# Patient Record
Sex: Female | Born: 1975 | Race: White | Hispanic: No | Smoking: Former smoker
Health system: Southern US, Community
[De-identification: ages and names within clinical notes are randomized; demographics above are authoritative.]

## PROBLEM LIST (undated history)

## (undated) DIAGNOSIS — F319 Bipolar disorder, unspecified: Secondary | ICD-10-CM

## (undated) DIAGNOSIS — F909 Attention-deficit hyperactivity disorder, unspecified type: Secondary | ICD-10-CM

## (undated) DIAGNOSIS — F329 Major depressive disorder, single episode, unspecified: Secondary | ICD-10-CM

## (undated) DIAGNOSIS — I10 Essential (primary) hypertension: Secondary | ICD-10-CM

## (undated) DIAGNOSIS — I499 Cardiac arrhythmia, unspecified: Secondary | ICD-10-CM

## (undated) DIAGNOSIS — F32A Depression, unspecified: Secondary | ICD-10-CM

## (undated) DIAGNOSIS — G43909 Migraine, unspecified, not intractable, without status migrainosus: Secondary | ICD-10-CM

## (undated) HISTORY — DX: Attention-deficit hyperactivity disorder, unspecified type: F90.9

## (undated) HISTORY — DX: Major depressive disorder, single episode, unspecified: F32.9

## (undated) HISTORY — PX: EYE SURGERY: SHX253

## (undated) HISTORY — DX: Bipolar disorder, unspecified: F31.9

## (undated) HISTORY — DX: Essential (primary) hypertension: I10

## (undated) HISTORY — DX: Migraine, unspecified, not intractable, without status migrainosus: G43.909

## (undated) HISTORY — DX: Cardiac arrhythmia, unspecified: I49.9

## (undated) HISTORY — DX: Depression, unspecified: F32.A

---

## 2004-10-17 ENCOUNTER — Ambulatory Visit: Payer: Self-pay

## 2004-11-05 ENCOUNTER — Ambulatory Visit: Payer: Self-pay | Admitting: Cardiology

## 2006-01-31 ENCOUNTER — Ambulatory Visit: Payer: Self-pay | Admitting: Cardiology

## 2006-03-27 ENCOUNTER — Other Ambulatory Visit: Admission: RE | Admit: 2006-03-27 | Discharge: 2006-03-27 | Payer: Self-pay | Admitting: Gynecology

## 2007-02-23 ENCOUNTER — Emergency Department (HOSPITAL_COMMUNITY): Admission: EM | Admit: 2007-02-23 | Discharge: 2007-02-23 | Payer: Self-pay | Admitting: *Deleted

## 2008-03-03 ENCOUNTER — Ambulatory Visit (HOSPITAL_COMMUNITY): Admission: RE | Admit: 2008-03-03 | Discharge: 2008-03-03 | Payer: Self-pay | Admitting: Obstetrics and Gynecology

## 2008-08-25 IMAGING — CR DG CHEST 2V
2 series · 2 of 2 positions shown · non-contrast
Comparison: none

CLINICAL DATA: Shortness of breath

Chest 2 view:
No previous for comparison. The heart size and mediastinal contours are within
normal limits.  Both lungs are clear.  The visualized skeletal structures are
unremarkable.

[w chest pa]
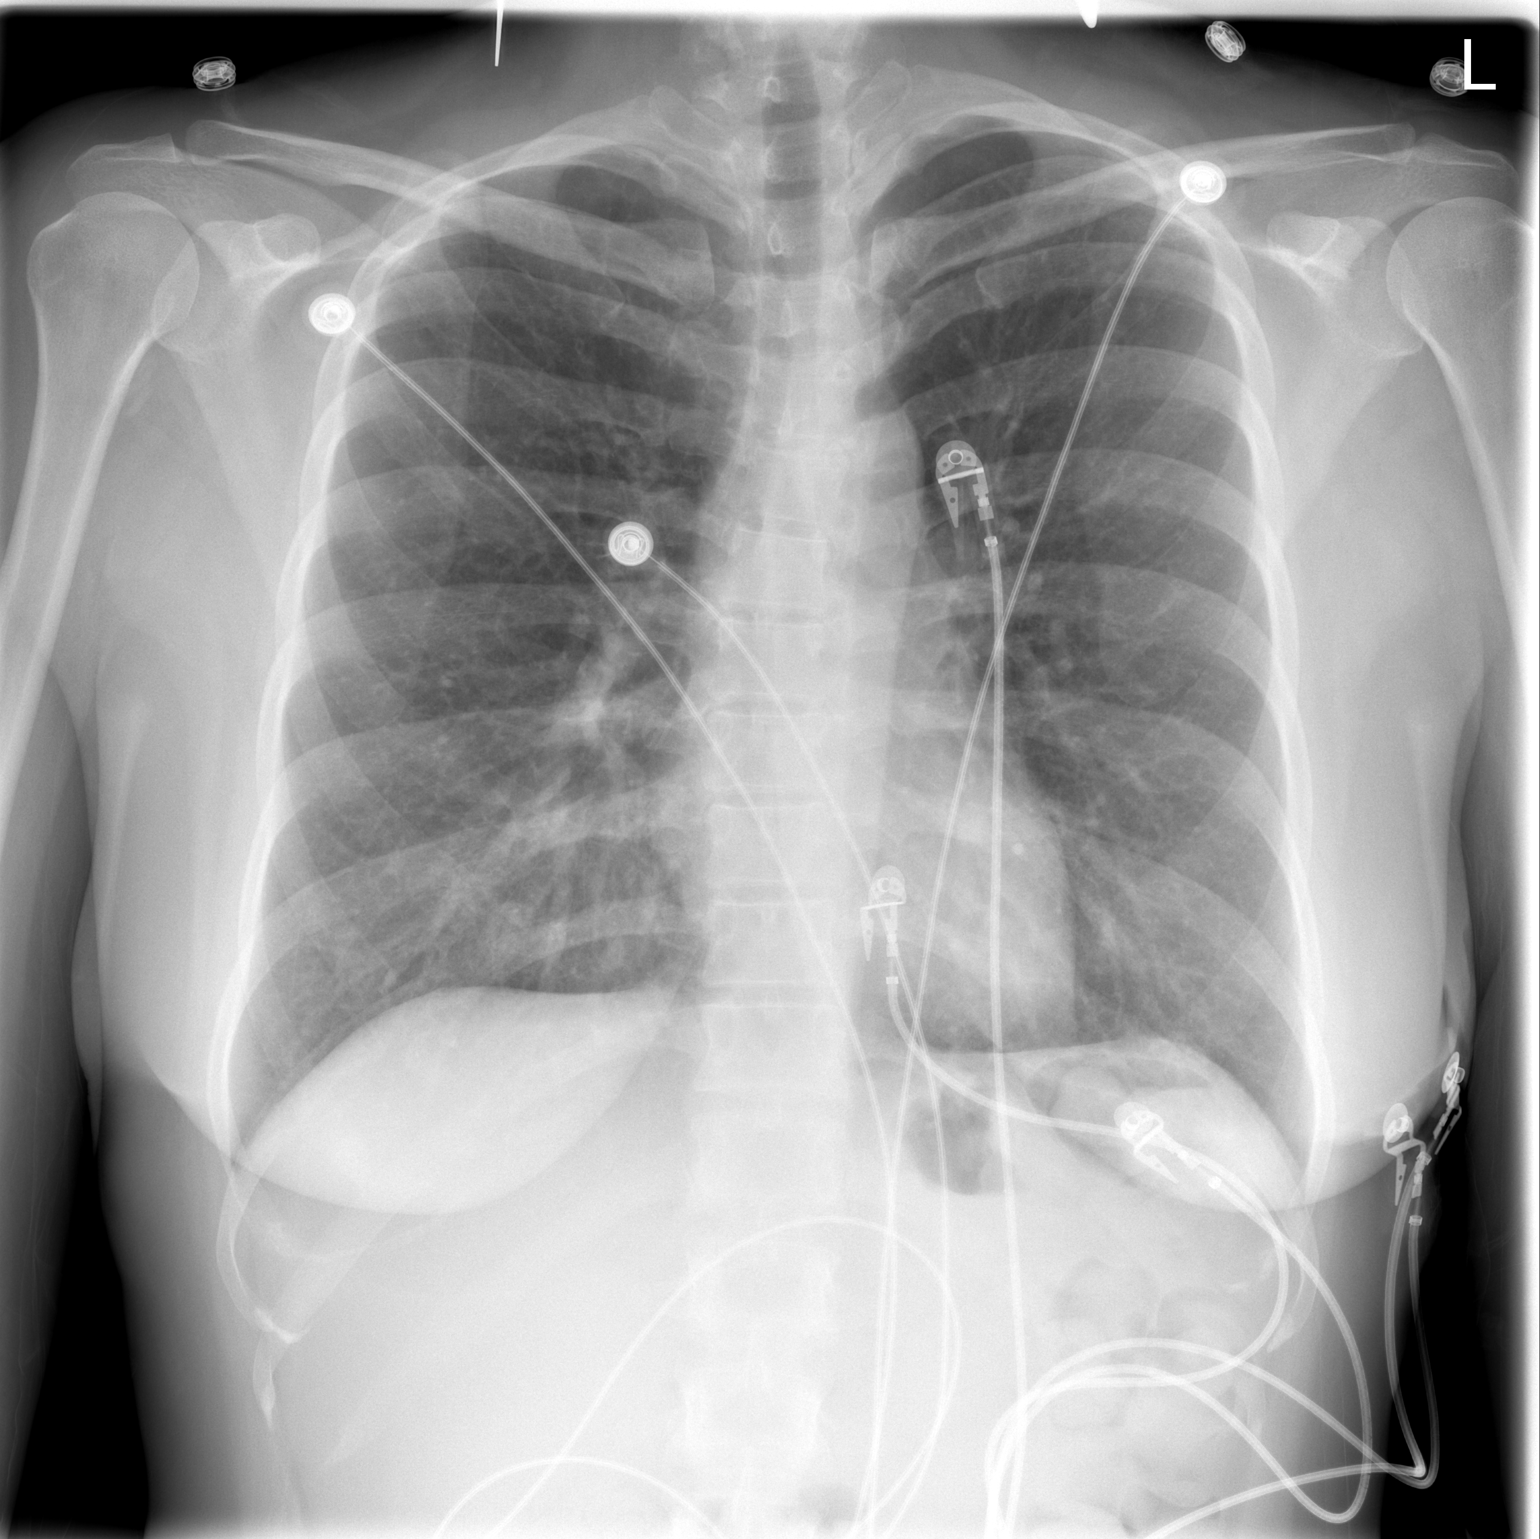

[w chest lat]
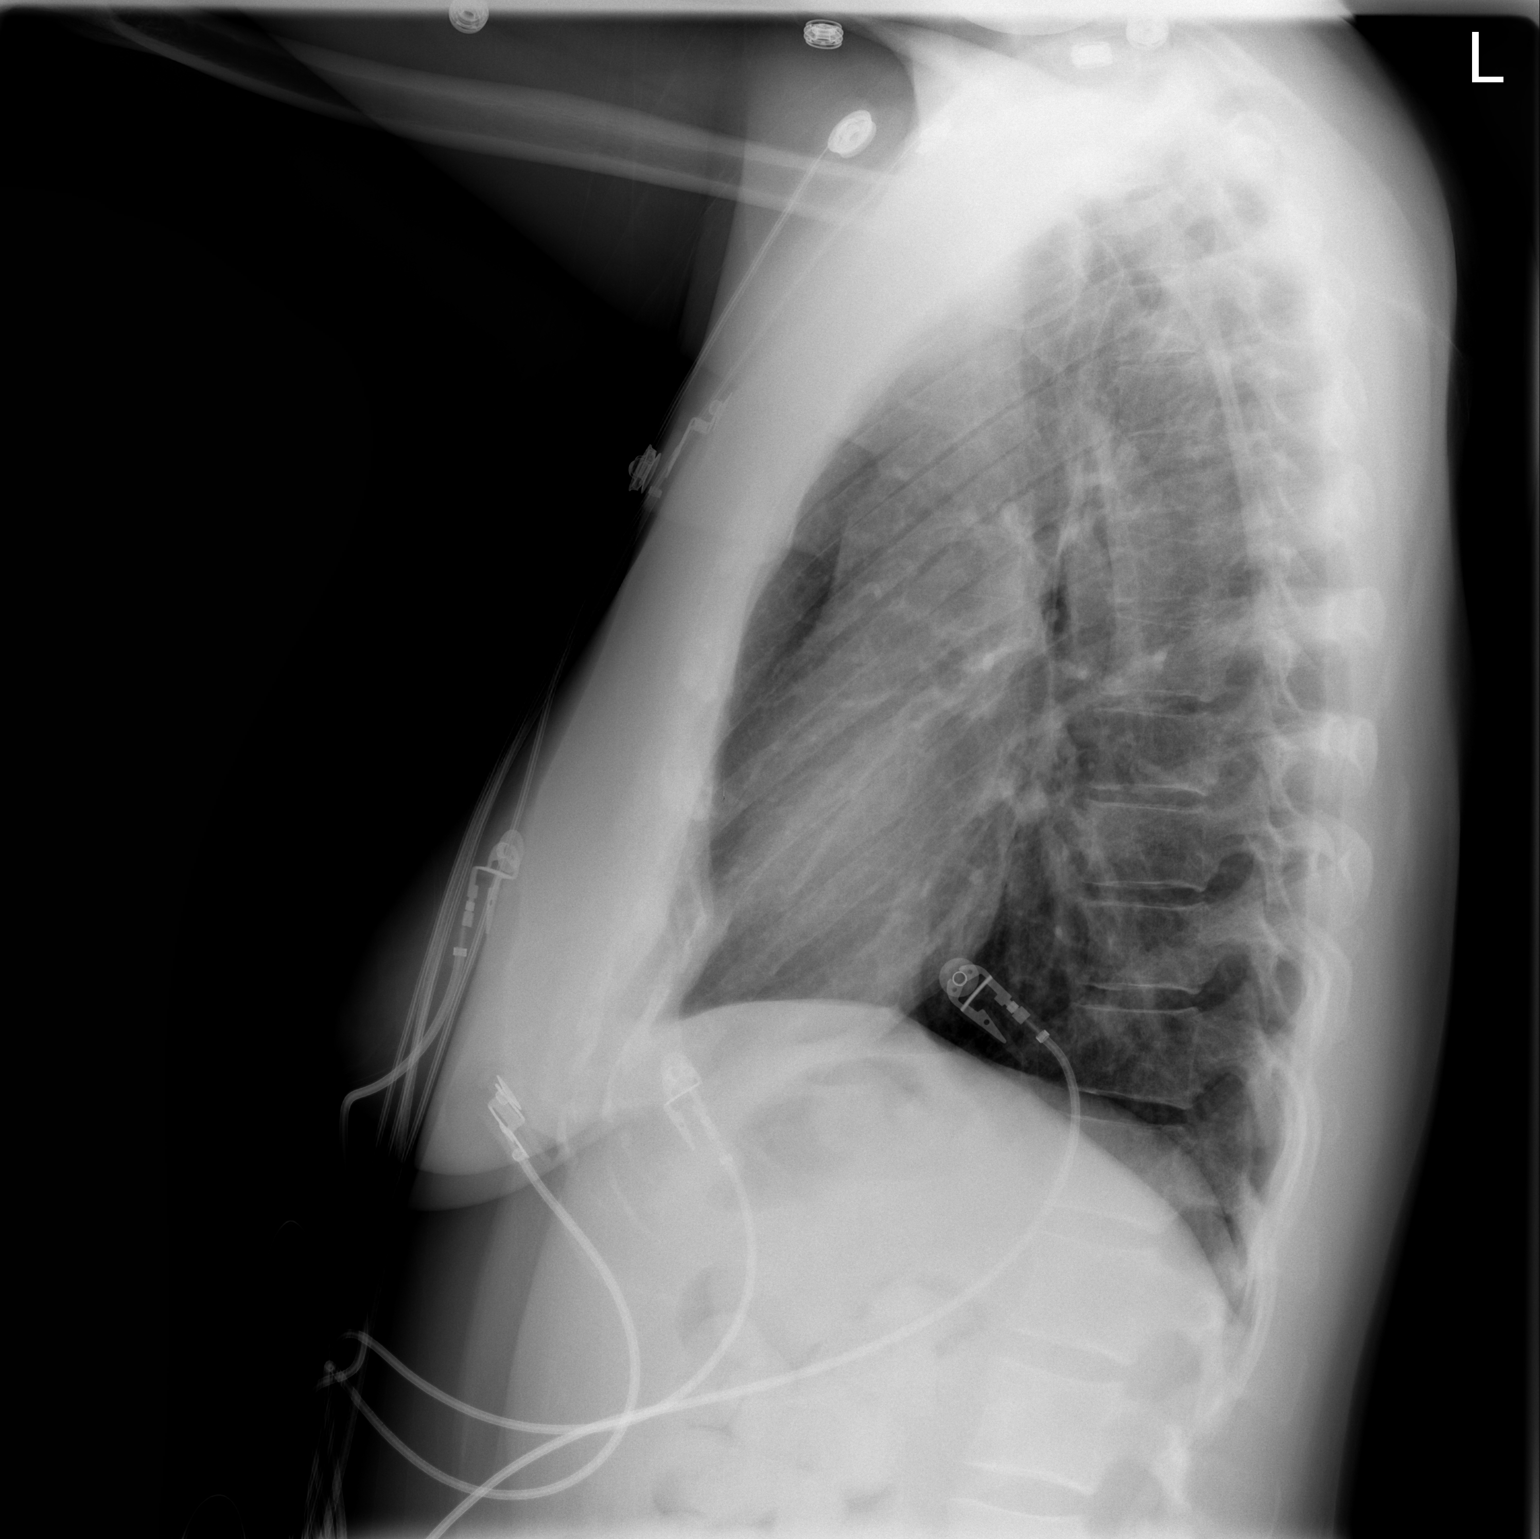

[2 of 2 positions shown; findings below may reference images not displayed]

IMPRESSION: 1. No active cardiopulmonary disease.

## 2008-09-11 ENCOUNTER — Encounter (INDEPENDENT_AMBULATORY_CARE_PROVIDER_SITE_OTHER): Payer: Self-pay | Admitting: Obstetrics and Gynecology

## 2008-09-11 ENCOUNTER — Inpatient Hospital Stay (HOSPITAL_COMMUNITY): Admission: AD | Admit: 2008-09-11 | Discharge: 2008-09-13 | Payer: Self-pay | Admitting: Obstetrics and Gynecology

## 2009-12-01 ENCOUNTER — Emergency Department (HOSPITAL_COMMUNITY): Admission: EM | Admit: 2009-12-01 | Discharge: 2009-12-02 | Payer: Self-pay | Admitting: Emergency Medicine

## 2010-05-02 LAB — POCT CARDIAC MARKERS
CKMB, poc: <1 ng/mL — ABNORMAL LOW (ref 1.0–8.0)
Troponin i, poc: <0.05 ng/mL (ref 0.00–0.09)

## 2010-05-03 LAB — COMPREHENSIVE METABOLIC PANEL
ALT: 92 U/L — ABNORMAL HIGH (ref 0–35)
AST: 74 U/L — ABNORMAL HIGH (ref 0–37)
Alkaline Phosphatase: 31 U/L — ABNORMAL LOW (ref 39–117)
CO2: 22 mEq/L (ref 19–32)
Chloride: 105 mEq/L (ref 96–112)
GFR calc Af Amer: >60 mL/min (ref >60)
GFR calc non Af Amer: >60 mL/min (ref >60)
Glucose, Bld: 117 mg/dL — ABNORMAL HIGH (ref 70–99)
Potassium: 3.7 mEq/L (ref 3.5–5.1)
Sodium: 137 mEq/L (ref 135–145)
Total Bilirubin: 0.9 mg/dL (ref 0.3–1.2)

## 2010-05-03 LAB — CBC
HCT: 44.1 % (ref 36.0–46.0)
Hemoglobin: 15.4 g/dL — ABNORMAL HIGH (ref 12.0–15.0)
RBC: 4.67 MIL/uL (ref 3.87–5.11)

## 2010-05-03 LAB — POCT CARDIAC MARKERS
CKMB, poc: <1 ng/mL — ABNORMAL LOW (ref 1.0–8.0)
Troponin i, poc: <0.05 ng/mL (ref 0.00–0.09)

## 2010-05-03 LAB — DIFFERENTIAL
Basophils Absolute: 0 10*3/uL (ref 0.0–0.1)
Basophils Relative: 0 % (ref 0–1)
Eosinophils Absolute: 0 10*3/uL (ref 0.0–0.7)
Eosinophils Relative: 1 % (ref 0–5)
Lymphs Abs: 1.2 10*3/uL (ref 0.7–4.0)
Neutrophils Relative %: 71 % (ref 43–77)

## 2010-05-03 LAB — D-DIMER, QUANTITATIVE: D-Dimer, Quant: 0.22 ug/mL-FEU (ref 0.00–0.48)

## 2010-05-27 LAB — CBC
Hemoglobin: 10.5 g/dL — ABNORMAL LOW (ref 12.0–15.0)
Hemoglobin: 9.8 g/dL — ABNORMAL LOW (ref 12.0–15.0)
Platelets: 181 10*3/uL (ref 150–400)
RBC: 3.29 MIL/uL — ABNORMAL LOW (ref 3.87–5.11)
RDW: 16.1 % — ABNORMAL HIGH (ref 11.5–15.5)
WBC: 7.6 10*3/uL (ref 4.0–10.5)

## 2010-05-27 LAB — URINALYSIS, DIPSTICK ONLY
Bilirubin Urine: NEGATIVE
Glucose, UA: NEGATIVE mg/dL
Leukocytes, UA: NEGATIVE
Nitrite: NEGATIVE
Specific Gravity, Urine: >1.03 — ABNORMAL HIGH (ref 1.005–1.030)
pH: 6 (ref 5.0–8.0)

## 2010-07-03 NOTE — Letter (Signed)
March 03, 2008    Tricia Sanford, M.D.  [facility]  [address]  [place], [place] [address]   Tricia Sanford, Tricia Sanford  MR#   [id number]  ACC#        [id number]   MFM CONSULTATION REPORT   Dear Dr. Renaldo Fiddler:   Thank you for referring your patient, Tricia Sanford, for maternal  fetal medicine consultation.  As you are aware, Tricia Sanford is a 35-year-  old gravida 3, para 2 at 12 weeks and 5 days gestation based on her  early ultrasound.  As you are also aware, Tricia Sanford has a history of  bipolar disorder and is on multiple medications for treatment of this.   Tricia Sanford was feeling well today and had no specific complaints.  She  denied any uterine cramping or vaginal bleeding since her last menstrual  period.   Tricia Sanford has a long history of bipolar disorder.  Her current  medications consist of Lamictal 20 mg daily, Seroquel 400 mg daily and  Concerta 54 mg daily.  She reports being on this regimen for the last 2-  3 years and follows closely with Dr. Kathrynn Running, her primary care  physician.  She states that she sees Dr. Kathrynn Running approximately every 3  months.   In addition to bipolar disorder, Tricia Sanford also has a history of  hypothyroidism for which she is treated with Synthroid 50 mg daily.  She  reports undergoing evaluation of her thyroid function early in this  gestation with normal results.  Prenatal records indicate these were  drawn on February 16, 2008, and TSH was 3.275.  Of note, her free T4 was  slightly out of range and on the low side at 0.84 ng/dL.   Tricia Sanford other medications include prenatal vitamins and over-the-  counter flaxseed oil and acidophilus.  She has no significant surgical  history and had has no history of abnormal Pap smears or GYN procedures.  Tricia Sanford does not smoke or use alcohol or street drugs.  She denies any  significant genetic diseases or congenital anomalies in either her or  the father of the baby's family.  Ms.  Catha Sanford past obstetrical history  includes 2 prior term vaginal deliveries without complication.   On exam today, Tricia Sanford appeared in no acute distress.  Her blood  pressure was 137/87, and her pulse was 82 beats per minute.  Her weight  was 183 pounds.   The implications of bipolar disorder as well as hypothyroidism were  discussed with Tricia Sanford at length including potential fetal effects of  her medications.  It was emphasized that the lowest dose of the least  number of medications to achieve good control of her symptoms was the  preferred approach to use of these drugs during pregnancy.  Tricia Sanford is  currently stable on her current regimen, and thus, a careful look at the  risk to benefit ratio for Tricia Sanford will be necessary.   Specifically, Seroquel has not been associated with any consistent  pattern of congenital anomalies in infants of mothers exposed to this  medication in utero.  There may be a slight increase above baseline of  anomalies as a whole, but again no specific pattern is seen.  The  product labeling for Seroquel recommends that breast-feeding be avoided  with the use of this medication, but there is minimal data to support  this recommendation.  Lamictal may be associated with a slight increase  in oral clefting.  This has been demonstrated in Sanford case studies.  There is also a question that neural tube anomalies may be more frequent  in babies exposed to this medication.  However, again, these are limited  studies with limited number of patients.  There may be some concern for  breast-feeding in moms taking Lamictal as high percentage of this is  excreted into breast milk.  However, studies looking at breastfed  infants has not demonstrated adverse effect.  Concerta is in the class  of the drugs of amphetamines.  There has been no increase in congenital  anomalies noted with exposure to this medication.  However, low birth  weight as well as a neonate  withdrawal syndrome has been described.  This seems to have a dose response relationship, and so patients are on  lower doses or intermittent dosing of these medications tend to see less  effects from this.  In addition to the risks associated with each  specific agent, it emphasized to Tricia Sanford that we cannot comment on the  interaction of these agents when taken together and the effect that this  may have on her fetus.  It was also stressed that these risks appeared  to be Sanford, and if Tricia Sanford receives significant benefit from the use  of these medications during pregnancy, it may be preferable to continue  their use.  Discontinuation of these kinds of medications can lead to  significant recurrence of psychiatric symptoms.   Hypothyroidism and the benefits of maintaining an euthyroid state during  pregnancy were also discussed with Tricia Sanford.  Her last set of thyroid  function studies indicate a slightly decreased free T4 level.  Reevaluation of these levels is recommended, and should that be a  persistent finding, an increase in her dose of Synthroid would be  advisable.  Thyroid function studies should be checked every semester  including a TSH, free T4 and free T3.  Adjustments in Synthroid should  be made to keep the patient's levels within the normal range or slightly  above normal for free T3 and free T4 levels.  The benefits of this  approach to the fetus were emphasized.   Tricia Sanford was advised to continue close care with her psychiatric  Sion Reinders.  She will discuss her medications and what if any changes  might be advisable for her with him at her next follow-up visit.  He was  also discussed that Tricia Sanford is nearly through the period of  organogenesis for her fetus and that changes for the purpose of voiding  anomalies may be of limited benefit at this time.  We would recommend  serial ultrasounds to evaluate fetal growth through the late second and  third trimester.   Additionally, a detailed fetal anatomic survey is  recommend at approximately 18 weeks' gestation.  It will also be  necessary to notify the neonatologist at the time of delivery that Ms.  Sanford has been exposed to these medications, particularly Concerta, and  close observation for the development of neonatal withdrawal syndromes  will be necessary.  Thank you for allowing [place] to participate in the care  of Tricia Sanford.  We will be pleased to perform any of these exams.  If  this is desired, please call our office to schedule this appointment.  The number is [phone].  Please feel free to contact us at any time  regarding this or any other patient you may have.  Prenatal  Sincerely,      Heather L. Rachel Bo, MD  Maternal Fetal Medicine  [facility]     HLM/MEDQ  D:  03/04/2008  T:  03/04/2008  Job:  [id number]

## 2010-07-06 NOTE — Assessment & Plan Note (Signed)
[facility]                            CARDIOLOGY OFFICE NOTE   NAME:SMALLEimy, Plaza                      MRN:          [id number]  DATE:01/31/2006                            DOB:          1975-10-19    REFERRING PHYSICIAN:  Areatha Keas, M.D.   PRIMARY CARE PHYSICIAN:  Dr. Merri Brunette.   REASON FOR REFERRAL:  Evaluate patient with shortness of breath.   HISTORY OF PRESENT ILLNESS:  The patient is a very lovely 35 year old  white female with a complicated past history.  She has a long history of  Sjogren's disease.  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  She subsequently had 6 cycles of CHOP, completed in April.  She  also had intrathecal therapy completed in July.  She had a lung nodule  which was found to be an adenocarcinoma, and had resection of this and  resection of her thymus gland in April.  Through all of this she said  she did relatively well.  However, since the end of September she has  had increasing dyspnea.  This has been progressive.  She has noticed it  doing such things as walking a flight of stairs.  Over the last several  nights she has felt more short of breath at night and has been sleeping  on 2 pillows.  She is not waking up with significant dyspnea.  She says  her chest feels full.  She is not describing chest pressure or neck  discomfort.  She has had no arm discomfort.  She is not describing  palpitations, presyncope or syncope.  She has had no weight gain, cough  or fever.  She has had no lower extremity swelling.  She was seen by Dr.  Phylliss Bob in routine followup and referred here with question of congestive  heart failure.   PAST MEDICAL HISTORY:  1. Sjogren's syndrome.  2. Vulvar cancer.  3. Adenocarcinoma of the lung.  4. B-cell lymphoma.  5. Anemia.   PAST SURGICAL HISTORY:  1. Mediastinoscopy with biopsy of the thymus  gland, parotid gland,      nasopharynx, maxillary sinus.  2. Thymusectomy and wedge resection of the right upper lobe.   ALLERGIES:  SULFA and ERYTHROMYCIN.   MEDICATIONS:  Restasis drops.   SOCIAL HISTORY:  The patient is currently unemployed with her multiple  illnesses.  She has one young adult daughter.  She smoked a pack per day  for 25 years but quit 1 year ago.   FAMILY HISTORY:  Noncontributory for early coronary artery disease.   REVIEW OF SYSTEMS:  Positive for headaches, dry mouth, cramps, joint  pains related to vasculitis.  Negative for other systems.   PHYSICAL EXAMINATION:  The patient is in no acute distress.  Weight 177  pounds, body mass index 29, blood pressure 108/64, heart rate 104 and  regular.  HEENT:  Eyelids unremarkable, pupils equal, round and reactive to light,  fundi within normal limits, oral mucosa unremarkable.  NECK:  No  jugular venous distention at 45 degrees, carotid upstroke  brisk and symmetric, no bruits, no thyromegaly.  LYMPHATICS:  No cervical, axillary or inguinal adenopathy.  LUNGS:  Clear to auscultation bilaterally.  BACK:  No costovertebral angle tenderness.  CHEST:  Unremarkable.  HEART:  PMI not displaced or sustained, S1 and S2 within normal limits,  no S4, positive loud S3, no murmurs.  ABDOMEN:  Flat, positive bowel sounds, normal in frequency and pitch, no  bruits, no rebound, no guarding, no midline pulsatile mass, no  hepatomegaly, no splenomegaly.  SKIN:  No rashes, no nodules.  EXTREMITIES:  2+ pulses, trace bilateral lower extremity edema, no  cyanosis, no clubbing.  NEUROLOGIC:  Oriented to person, place and time, cranial nerves II-XII  grossly intact, motor grossly intact.   EKG sinus tachycardia, rate 104, right superior axis, premature  ventricular contractions.   Echocardiogram done today demonstrated EF of 20% to 25% with diffuse  ventricular hypokinesis.  There is mild to moderate mitral  regurgitation.  There  was moderately reduced right ventricular systolic  dysfunction.   ASSESSMENT AND PLAN:  1. Cardiomyopathy:  The patient has a newly-diagnosed cardiomyopathy,      as Dr. Phylliss Bob suspected.  This is almost definitely related to her      chemotherapy.  I spent quite a bit of time discussing this with her      today.  We went over the physiology and treatment.  At this point I      need to get a BMET.  I will also do a TSH, liver profile and a CBC.      She will also get a baseline BNP level.  I am going to treat her      first symptomatically with Lasix 40 mg daily.  She will get      potassium 20 mEq daily to go along with this.  I am going to start      enalapril 2.5 mg b.i.d.  She understands that if she gets worse      rather than better she needs to present to the hospital for further      therapy.  I am going to see her early next week for further      management and med titration.  She will eventually have a workup to      exclude ischemia,      though I think this is very unlikely.  2. Followup as above.     Rollene Rotunda, MD, [facility]  Electronically Signed    JH/MedQ  DD: 01/31/2006  DT: 02/01/2006  Job #: [id number]   cc:   Areatha Keas, M.D.  Dario Guardian, M.D.

## 2010-11-08 LAB — CBC
HCT: 38.6
Hemoglobin: 13
MCHC: 33.8
MCV: 86.8
Platelets: 231
RBC: 4.45
RDW: 17.1 — ABNORMAL HIGH
WBC: 5.1

## 2010-11-08 LAB — URINALYSIS, ROUTINE W REFLEX MICROSCOPIC
Glucose, UA: NEGATIVE
Ketones, ur: 15 — AB
Protein, ur: NEGATIVE

## 2010-11-08 LAB — COMPREHENSIVE METABOLIC PANEL
BUN: 5 — ABNORMAL LOW
CO2: 23
Calcium: 9.2
Creatinine, Ser: 0.6
GFR calc non Af Amer: >60
Glucose, Bld: 146 — ABNORMAL HIGH

## 2010-11-08 LAB — POCT CARDIAC MARKERS
Myoglobin, poc: 26.3
Operator id: 288831
Troponin i, poc: <0.05

## 2010-11-08 LAB — DIFFERENTIAL
Eosinophils Absolute: 0.1
Lymphocytes Relative: 32
Lymphs Abs: 1.6
Neutrophils Relative %: 55

## 2010-11-08 LAB — URINE MICROSCOPIC-ADD ON

## 2012-01-27 DIAGNOSIS — F319 Bipolar disorder, unspecified: Secondary | ICD-10-CM | POA: Insufficient documentation

## 2012-03-09 DIAGNOSIS — K7581 Nonalcoholic steatohepatitis (NASH): Secondary | ICD-10-CM | POA: Insufficient documentation

## 2013-03-01 DIAGNOSIS — I10 Essential (primary) hypertension: Secondary | ICD-10-CM | POA: Insufficient documentation

## 2016-01-20 ENCOUNTER — Other Ambulatory Visit (HOSPITAL_COMMUNITY): Payer: Self-pay | Admitting: Psychiatry

## 2016-06-20 DIAGNOSIS — Z6832 Body mass index (BMI) 32.0-32.9, adult: Secondary | ICD-10-CM

## 2016-06-20 DIAGNOSIS — E6609 Other obesity due to excess calories: Secondary | ICD-10-CM | POA: Insufficient documentation

## 2018-01-30 DIAGNOSIS — L2082 Flexural eczema: Secondary | ICD-10-CM | POA: Insufficient documentation

## 2018-04-01 ENCOUNTER — Encounter (HOSPITAL_COMMUNITY): Payer: Self-pay | Admitting: Psychiatry

## 2018-04-01 ENCOUNTER — Encounter (INDEPENDENT_AMBULATORY_CARE_PROVIDER_SITE_OTHER): Payer: Self-pay

## 2018-04-01 ENCOUNTER — Ambulatory Visit (INDEPENDENT_AMBULATORY_CARE_PROVIDER_SITE_OTHER): Payer: Commercial Managed Care - PPO | Admitting: Psychiatry

## 2018-04-01 VITALS — BP 120/81 | HR 87 | Ht 64.0 in | Wt 189.0 lb

## 2018-04-01 DIAGNOSIS — F319 Bipolar disorder, unspecified: Secondary | ICD-10-CM

## 2018-04-01 DIAGNOSIS — G43909 Migraine, unspecified, not intractable, without status migrainosus: Secondary | ICD-10-CM | POA: Insufficient documentation

## 2018-04-01 DIAGNOSIS — F901 Attention-deficit hyperactivity disorder, predominantly hyperactive type: Secondary | ICD-10-CM | POA: Diagnosis not present

## 2018-04-01 DIAGNOSIS — F988 Other specified behavioral and emotional disorders with onset usually occurring in childhood and adolescence: Secondary | ICD-10-CM | POA: Insufficient documentation

## 2018-04-01 DIAGNOSIS — E039 Hypothyroidism, unspecified: Secondary | ICD-10-CM | POA: Insufficient documentation

## 2018-04-01 MED ORDER — ARIPIPRAZOLE 15 MG PO TABS: 15.0000 mg | ORAL_TABLET | Freq: Every day | ORAL | 1 refills | Status: DC

## 2018-04-01 MED ORDER — LAMOTRIGINE 200 MG PO TABS: 200.0000 mg | ORAL_TABLET | Freq: Every day | ORAL | 1 refills | Status: DC

## 2018-04-01 MED ORDER — METHYLPHENIDATE HCL ER (OSM) 54 MG PO TBCR: 54.0000 mg | EXTENDED_RELEASE_TABLET | ORAL | 0 refills | Status: DC
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
## 2018-05-12 ENCOUNTER — Other Ambulatory Visit: Payer: Self-pay

## 2018-05-12 ENCOUNTER — Ambulatory Visit (INDEPENDENT_AMBULATORY_CARE_PROVIDER_SITE_OTHER): Payer: Commercial Managed Care - PPO | Admitting: Psychiatry

## 2018-05-12 ENCOUNTER — Encounter (HOSPITAL_COMMUNITY): Payer: Self-pay | Admitting: Psychiatry

## 2018-05-12 DIAGNOSIS — F319 Bipolar disorder, unspecified: Secondary | ICD-10-CM

## 2018-05-12 DIAGNOSIS — F901 Attention-deficit hyperactivity disorder, predominantly hyperactive type: Secondary | ICD-10-CM

## 2018-05-12 MED ORDER — LAMOTRIGINE 200 MG PO TABS: 200.0000 mg | ORAL_TABLET | Freq: Every day | ORAL | 0 refills | Status: DC

## 2018-05-12 MED ORDER — METHYLPHENIDATE HCL ER (OSM) 54 MG PO TBCR: 54.0000 mg | EXTENDED_RELEASE_TABLET | ORAL | 0 refills | Status: DC

## 2018-05-12 MED ORDER — ARIPIPRAZOLE 15 MG PO TABS: 15.0000 mg | ORAL_TABLET | Freq: Every day | ORAL | 0 refills | Status: DC

## 2018-05-12 NOTE — Progress Notes (Signed)
BH MD/PA/NP OP Progress Note  05/12/2018 10:37 AM Tricia Sanford  MRN:  [id number]  Chief Complaint: I am taking my medication.  Sometimes I get overwhelmed because of pandemic Covid 19 coronavirus.  HPI: Tricia Sanford came for her appointment.  She is a 43 year old female who was seen first time in February 2020.  She is a diagnosis of bipolar disorder, anxiety and ADHD.  She was taking Ritalin, Concerta, Lamictal and Abilify.  She was also taking Xanax.  We have discontinue her Ritalin and Xanax.  Patient has no formal psychological testing and the medications were given by primary care physician who had left the practice.  We have referred her to do psychological testing but she has not heard from the office.  However she noticed stopping Ritalin she is not having as much anxiety and does not require to take Xanax.  She is overwhelmed because of pandemic coronavirus.  She sometimes gets stressed because of home schooling of his 41-year-old son and also working from home.  But she is pleased and relieved that she is working from home.  Overall she admitted less emotional, tearful and having crying spells.  Her attention and focus is okay.  She is able to do multitasking.  She denies any paranoia, hallucination or any severe mood swings or highs and lows.  Her sister and her 19 year old nephew lives with her.  She is working as a [employer] from home.  She denies any suicidal thoughts or homicidal thought.  She denies any rash or any itching.  She has not decided therapy because she feels things are going very well.  She like to continue her current medication.  She admitted drinking very rarely but significantly cut down in recent months.  She denies any illegal substance use.  Her energy level is okay.  Visit Diagnosis:    ICD-10-CM   1. Attention deficit hyperactivity disorder (ADHD), predominantly hyperactive type F90.1 methylphenidate 54 MG PO CR tablet  2. Bipolar I disorder (HCC) F31.9  lamoTRIgine (LAMICTAL) 200 MG tablet    ARIPiprazole (ABILIFY) 15 MG tablet    Past Psychiatric History: Reviewed. H/O mood swing, anger, auditory and visual hallucination, impulsive behavior with excessive buying and shopping.  H/O inpatient in 2001 at [facility] for taking overdose with alcohol.  Never seen psychiatrist upon discharge and given psychiatric medication by PCP.  Took Seroquel but stopped after pregnancy.  Took Ritalin, Concerta, Abilify, Lamictal and Xanax from PCP.  Never had psychological testing for ADHD.    Past Medical History:  Past Medical History:  Diagnosis Date  . ADHD (attention deficit hyperactivity disorder)   . Arrhythmia   . Bipolar disorder (HCC)   . Depression   . Hypertension   . Migraines     Past Surgical History:  Procedure Laterality Date  . EYE SURGERY      Family Psychiatric History: Reviewed  Family History:  Family History  Problem Relation Age of Onset  . Alcohol abuse Father   . ADD / ADHD Sister   . Bipolar disorder Sister   . Depression Sister   . Drug abuse Sister   . Physical abuse Sister     Social History:  Social History   Socioeconomic History  . Marital status: Divorced    Spouse name: Not on file  . Number of children: 3  . Years of education: Not on file  . Highest education level: High school graduate  Occupational History  . Not on file  Social Needs  .  Financial resource strain: Not hard at all  . Food insecurity:    Worry: Never true    Inability: Never true  . Transportation needs:    Medical: No    Non-medical: No  Tobacco Use  . Smoking status: Former Smoker    Types: Cigarettes  . Smokeless tobacco: Never Used  Substance and Sexual Activity  . Alcohol use: Yes    Alcohol/week: 2.0 standard drinks    Types: 2 Standard drinks or equivalent per week  . Drug use: Never  . Sexual activity: Not Currently  Lifestyle  . Physical activity:    Days per week: 0 days    Minutes per session: 0  min  . Stress: Rather much  Relationships  . Social connections:    Talks on phone: Never    Gets together: Once a week    Attends religious service: Never    Active member of club or organization: No    Attends meetings of clubs or organizations: Never    Relationship status: Divorced  Other Topics Concern  . Not on file  Social History Narrative  . Not on file    Allergies: No Known Allergies  Metabolic Disorder Labs: No results found for: HGBA1C, MPG No results found for: PROLACTIN No results found for: CHOL, TRIG, HDL, CHOLHDL, VLDL, LDLCALC No results found for: TSH  Therapeutic Level Labs: No results found for: LITHIUM No results found for: VALPROATE No components found for:  CBMZ  Current Medications: Current Outpatient Medications  Medication Sig Dispense Refill  . ARIPiprazole (ABILIFY) 15 MG tablet Take 1 tablet (15 mg total) by mouth at bedtime. 30 tablet 1  . benazepril-hydrochlorthiazide (LOTENSIN HCT) 10-12.5 MG tablet Take 1 tablet by mouth daily.    Marland Kitchen lamoTRIgine (LAMICTAL) 200 MG tablet Take 1 tablet (200 mg total) by mouth daily. 30 tablet 1  . levothyroxine (SYNTHROID, LEVOTHROID) 75 MCG tablet Take 75 mcg by mouth daily before breakfast.    . methylphenidate 54 MG PO CR tablet Take 1 tablet (54 mg total) by mouth every morning. Concerta 30 tablet 0   No current facility-administered medications for this visit.      Musculoskeletal: Strength & Muscle Tone: within normal limits Gait & Station: normal Patient leans: N/A  Psychiatric Specialty Exam: ROS  Blood pressure (!) 142/85, pulse 84, temperature 98.1 F (36.7 C), height 5\' 4"  (1.626 m), weight 193 lb (87.5 kg).Body mass index is 33.13 kg/m.  General Appearance: Casual  Eye Contact:  Good  Speech:  Clear and Coherent  Volume:  Normal  Mood:  Anxious  Affect:  Appropriate  Thought Process:  Goal Directed  Orientation:  Full (Time, Place, and Person)  Thought Content: Rumination    Suicidal Thoughts:  No  Homicidal Thoughts:  No  Memory:  Immediate;   Good Recent;   Good Remote;   Good  Judgement:  Good  Insight:  Good  Psychomotor Activity:  Normal  Concentration:  Concentration: Fair and Attention Span: Fair  Recall:  Good  Fund of Knowledge: Good  Language: Good  Akathisia:  No  Handed:  Right  AIMS (if indicated): not done  Assets:  Communication Skills Desire for Improvement Housing Physical Health Resilience  ADL's:  Intact  Cognition: WNL  Sleep:  Fair   Screenings:   Assessment and Plan: ADHD, hyperactive type by history.  Bipolar disorder type I.  Patient is still waiting for psychological testing.  We will remind the office to schedule appointment.  At  this time patient doing better and does not want to change medication.  I will continue Concerta 54 mg every day, Lamictal 200 mg daily and Abilify 15 mg daily.  Patient has no tremors, shakes, rash, EPS or any itching.  Discussed stimulant abuse, tolerance and withdrawal.  Discussed stopping alcohol as cause interaction with psychotropic medication.  Patient is interested in therapy as medicine is working well.  Reassurance given and recommended to call us back if she has any question or any concern then she should call us.  I will see her again in 3 months.  90-day supply of her medication sent to her local pharmacy.   Cleotis Nipper, MD 05/12/2018, 10:37 AM

## 2018-08-12 ENCOUNTER — Ambulatory Visit (INDEPENDENT_AMBULATORY_CARE_PROVIDER_SITE_OTHER): Payer: Commercial Managed Care - PPO | Admitting: Psychiatry

## 2018-08-12 ENCOUNTER — Other Ambulatory Visit: Payer: Self-pay

## 2018-08-12 ENCOUNTER — Encounter (HOSPITAL_COMMUNITY): Payer: Self-pay | Admitting: Psychiatry

## 2018-08-12 DIAGNOSIS — F319 Bipolar disorder, unspecified: Secondary | ICD-10-CM

## 2018-08-12 DIAGNOSIS — F901 Attention-deficit hyperactivity disorder, predominantly hyperactive type: Secondary | ICD-10-CM | POA: Diagnosis not present

## 2018-08-12 MED ORDER — LAMOTRIGINE 200 MG PO TABS: 200.0000 mg | ORAL_TABLET | Freq: Every day | ORAL | 0 refills | Status: DC

## 2018-08-12 MED ORDER — METHYLPHENIDATE HCL ER (OSM) 54 MG PO TBCR: 54.0000 mg | EXTENDED_RELEASE_TABLET | ORAL | 0 refills | Status: DC

## 2018-08-12 MED ORDER — ARIPIPRAZOLE 15 MG PO TABS: 15.0000 mg | ORAL_TABLET | Freq: Every day | ORAL | 0 refills | Status: DC

## 2018-08-12 NOTE — Progress Notes (Signed)
Virtual Visit via Telephone Note  I connected with Tricia Sanford on 08/12/18 at 10:20 AM EDT by telephone and verified that I am speaking with the correct person using two identifiers.   I discussed the limitations, risks, security and privacy concerns of performing an evaluation and management service by telephone and the availability of in person appointments. I also discussed with the patient that there may be a patient responsible charge related to this service. The patient expressed understanding and agreed to proceed.   History of Present Illness: Patient was evaluated by phone session.  She is taking her medication as prescribed.  She is working from home and sometimes job is very stressful because she has given more responsibilities.  Patient told some of the coworkers were laid off and she picked up extra responsibilities.  She feels the medicine working as she is not distracted at work.  Some nights she has difficulty falling asleep because she is thinking too much but overall she feels things are going well.  She does not leave the house unnecessary.  She is very cautious about COVID-19.  She has not taken Xanax for a while.  She placed few calls to do psychological testing but realized due to COVID-19 none of the office is taking new patient.  Patient like to continue current medication.  She is able to do multitasking and she reported her mood is stable.  She denies any irritability, mania, anger, crying spells.  She lives with her 45 year old son and her sister and her 45 year old nephew is also living with her.  She reported no tremors, shakes, rash, palpitation.  Her energy level is good.  She reported her weight is stable.  She drinks on occasion but overall is cut down significantly.  She denies any illegal substance use.  She is compliant with Abilify, Lamictal and Concerta.   Past Psychiatric History: Reviewed. H/O mood swing, anger, auditory and visual hallucination, impulsive  behavior with excessive buying and shopping.  H/O inpatient in 2001 at [facility] for taking overdose with alcohol.  Never seen psychiatrist upon discharge and given psychiatric medication by PCP.  Took Seroquel but stopped after pregnancy.  Took Ritalin, Concerta, Abilify, Lamictal and Xanax from PCP.  Never had psychological testing for ADHD.    Psychiatric Specialty Exam: Physical Exam  ROS  There were no vitals taken for this visit.There is no height or weight on file to calculate BMI.  General Appearance: NA  Eye Contact:  NA  Speech:  Clear and Coherent  Volume:  Normal  Mood:  Anxious  Affect:  NA  Thought Process:  Goal Directed  Orientation:  Full (Time, Place, and Person)  Thought Content:  Logical  Suicidal Thoughts:  No  Homicidal Thoughts:  No  Memory:  Immediate;   Good Recent;   Good Remote;   Good  Judgement:  Good  Insight:  Good  Psychomotor Activity:  Normal  Concentration:  Concentration: Fair and Attention Span: Fair  Recall:  Good  Fund of Knowledge:  Good  Language:  Good  Akathisia:  No  Handed:  Right  AIMS (if indicated):     Assets:  Communication Skills Desire for Improvement Housing Resilience Social Support Talents/Skills  ADL's:  Intact  Cognition:  WNL  Sleep:   fair      Assessment and Plan: ADHD, hyperactivity type.  Bipolar disorder type I.  Patient is a stable on her current medication.  She had placed few calls to psychological testing  but no one had returned phone call.  She feels the current medicine is working very well and she like to continue.  She has no rash, itching tremors or shakes.  I will continue Lamictal 200 mg daily, Abilify 15 mg daily and Concerta 54 mg in the morning.  Discussed medication side effects especially stimulant abuse, tolerance and withdrawal.  Recommended to call us back if she has any question or any concern.  She is not interested in therapy at this time.  I recommend to call us back if there is  any question or any concern.  Follow-up in 3 months.  She prefers [facility] for her prescription.  Follow Up Instructions:    I discussed the assessment and treatment plan with the patient. The patient was provided an opportunity to ask questions and all were answered. The patient agreed with the plan and demonstrated an understanding of the instructions.   The patient was advised to call back or seek an in-person evaluation if the symptoms worsen or if the condition fails to improve as anticipated.  I provided 15 minutes of non-face-to-face time during this encounter.   Kathlee Nations, MD

## 2018-11-12 ENCOUNTER — Ambulatory Visit (INDEPENDENT_AMBULATORY_CARE_PROVIDER_SITE_OTHER): Payer: Commercial Managed Care - PPO | Admitting: Psychiatry

## 2018-11-12 ENCOUNTER — Other Ambulatory Visit: Payer: Self-pay

## 2018-11-12 ENCOUNTER — Encounter (HOSPITAL_COMMUNITY): Payer: Self-pay | Admitting: Psychiatry

## 2018-11-12 DIAGNOSIS — F319 Bipolar disorder, unspecified: Secondary | ICD-10-CM

## 2018-11-12 DIAGNOSIS — F901 Attention-deficit hyperactivity disorder, predominantly hyperactive type: Secondary | ICD-10-CM

## 2018-11-12 MED ORDER — LAMOTRIGINE 200 MG PO TABS: 200.0000 mg | ORAL_TABLET | Freq: Every day | ORAL | 0 refills | Status: DC

## 2018-11-12 MED ORDER — ARIPIPRAZOLE 15 MG PO TABS: 15.0000 mg | ORAL_TABLET | Freq: Every day | ORAL | 0 refills | Status: DC

## 2018-11-12 MED ORDER — METHYLPHENIDATE HCL ER (OSM) 54 MG PO TBCR: 54.0000 mg | EXTENDED_RELEASE_TABLET | ORAL | 0 refills | Status: DC

## 2018-11-12 NOTE — Progress Notes (Signed)
Virtual Visit via Telephone Note  I connected with Tricia Sanford on 11/12/18 at 10:00 AM EDT by telephone and verified that I am speaking with the correct person using two identifiers.   I discussed the limitations, risks, security and privacy concerns of performing an evaluation and management service by telephone and the availability of in person appointments. I also discussed with the patient that there may be a patient responsible charge related to this service. The patient expressed understanding and agreed to proceed.   History of Present Illness: Patient was evaluated by phone session.  She is compliant with Concerta, Abilify and Lamictal.  She reported her mood is stable and denies any highs and lows or any mania.  She is working from home and sometimes she gets tired sitting on the computer for long day.  She lives with her 62 year old son and her sister and her 8 year old nephew.  She admitted some time racing thoughts and thinking too much about the work before she go to sleep.  She has chronic insomnia.  She does not want to reduce Concerta since it is helping her a lot.  She takes 7 days a week because on the weekend she also works.  She has no tremors, shakes or any EPS.  She is able to do multitasking and her attention concentration is okay.  She is able to complete her assignment on time.  Her energy level is good.  She has no tremors, shakes or any EPS.  She has not taken Xanax since the last visit which is prescribed by primary care physician.  She denies any palpitation and her weight is a stable.  Her appetite is okay.  She has no rash from Lamictal.  She denies any illegal substance use.     Past Psychiatric History:Reviewed. H/Omood swing, anger, auditory and visual hallucination, impulsive behavior with excessive buying and shopping. H/Oinpatient in 2001 at [facility] for taking overdose with alcohol. Never seen psychiatrist upon discharge and given psychiatric  medication by PCP. Took Seroquel but stopped after pregnancy. Took Ritalin, Concerta, Abilify, Lamictal and Xanax from PCP. Never had psychological testing for ADHD.    Psychiatric Specialty Exam: Physical Exam  ROS  There were no vitals taken for this visit.There is no height or weight on file to calculate BMI.  General Appearance: NA  Eye Contact:  NA  Speech:  Clear and Coherent  Volume:  Normal  Mood:  Anxious  Affect:  NA  Thought Process:  Goal Directed  Orientation:  Full (Time, Place, and Person)  Thought Content:  WDL  Suicidal Thoughts:  No  Homicidal Thoughts:  No  Memory:  Immediate;   Good Recent;   Good Remote;   Good  Judgement:  Good  Insight:  Good  Psychomotor Activity:  NA  Concentration:  Concentration: Fair and Attention Span: Fair  Recall:  Good  Fund of Knowledge:  Good  Language:  Good  Akathisia:  No  Handed:  Right  AIMS (if indicated):     Assets:  Communication Skills Desire for Improvement Housing Resilience Social Support Talents/Skills Transportation  ADL's:  Intact  Cognition:  WNL  Sleep:   ok      Assessment and Plan: ADHD, hyperactivity type.  Bipolar disorder type I.  Patient is a stable on her current medication.  She has not able to get appointment for psychological testing since due to COVID no one is taking a new patient for testing.  She like to continue her  current medication.  I recommend to try melatonin over-the-counter to see if that can help her insomnia.  Continue Lamictal 200 mg daily, Abilify 50 mg daily and Concerta 54 mg in the morning.  Discussed medication side effects and benefits.  Patient prefer local pharmacy at [facility] because in the past her medicines were shipped to her neighbors.  Recommended to call us back if she has any question or any concern.  Follow-up in 3 months.  Follow Up Instructions:    I discussed the assessment and treatment plan with the patient. The patient was provided an  opportunity to ask questions and all were answered. The patient agreed with the plan and demonstrated an understanding of the instructions.   The patient was advised to call back or seek an in-person evaluation if the symptoms worsen or if the condition fails to improve as anticipated.  I provided 20 minutes of non-face-to-face time during this encounter.   Cleotis Nipper, MD

## 2019-02-08 ENCOUNTER — Ambulatory Visit (HOSPITAL_COMMUNITY): Payer: Commercial Managed Care - PPO | Admitting: Psychiatry

## 2019-02-09 ENCOUNTER — Telehealth (HOSPITAL_COMMUNITY): Payer: Self-pay | Admitting: *Deleted

## 2019-02-09 NOTE — Telephone Encounter (Signed)
Patient called asking for refill of Concerta. Chart reviewed, filled 9/24 with 3 month supply given. patient next appointment 12/28. Will notify MD if he would like any refills given.

## 2019-02-10 ENCOUNTER — Other Ambulatory Visit (HOSPITAL_COMMUNITY): Payer: Self-pay | Admitting: Psychiatry

## 2019-02-10 ENCOUNTER — Telehealth (HOSPITAL_COMMUNITY): Payer: Self-pay

## 2019-02-10 DIAGNOSIS — F319 Bipolar disorder, unspecified: Secondary | ICD-10-CM

## 2019-02-10 MED ORDER — ARIPIPRAZOLE 15 MG PO TABS: 15.0000 mg | ORAL_TABLET | Freq: Every day | ORAL | 0 refills | Status: DC

## 2019-02-10 MED ORDER — LAMOTRIGINE 200 MG PO TABS: 200.0000 mg | ORAL_TABLET | Freq: Every day | ORAL | 0 refills | Status: DC

## 2019-02-10 NOTE — Telephone Encounter (Signed)
Probably only lamotrigine as an anticonvulsant is essential this week but I have reordered both.

## 2019-02-10 NOTE — Telephone Encounter (Signed)
Medication refill - Fax received from [place] for new orders for patient's prescribed Abilify and Lamictal, last filled with 90 day orders 11/12/18 and patient is set to return to see Dr. Adele Schilder 02/15/19.  Attempted to reach pt to see if has enough until appointment but no answer at phone number listed.

## 2019-02-15 ENCOUNTER — Ambulatory Visit (INDEPENDENT_AMBULATORY_CARE_PROVIDER_SITE_OTHER): Payer: Commercial Managed Care - PPO | Admitting: Psychiatry

## 2019-02-15 ENCOUNTER — Other Ambulatory Visit: Payer: Self-pay

## 2019-02-15 ENCOUNTER — Encounter (HOSPITAL_COMMUNITY): Payer: Self-pay | Admitting: Psychiatry

## 2019-02-15 DIAGNOSIS — F319 Bipolar disorder, unspecified: Secondary | ICD-10-CM

## 2019-02-15 DIAGNOSIS — F901 Attention-deficit hyperactivity disorder, predominantly hyperactive type: Secondary | ICD-10-CM

## 2019-02-15 MED ORDER — METHYLPHENIDATE HCL ER (OSM) 54 MG PO TBCR: 54.0000 mg | EXTENDED_RELEASE_TABLET | ORAL | 0 refills | Status: DC

## 2019-02-15 NOTE — Progress Notes (Signed)
Virtual Visit via Telephone Note  I connected with Tricia Sanford on 02/15/19 at 10:00 AM EST by telephone and verified that I am speaking with the correct person using two identifiers.   I discussed the limitations, risks, security and privacy concerns of performing an evaluation and management service by telephone and the availability of in person appointments. I also discussed with the patient that there may be a patient responsible charge related to this service. The patient expressed understanding and agreed to proceed.   History of Present Illness: Patient was evaluated by phone session.  She is compliant with Concerta Abilify and Lamictal.  She reported her mood is stable and she denies any highs and lows, mania, psychosis or any hallucination.  She continues to work from home and there are days that she works long hours as job is recently overwhelming.  She is working in a company called [employer].  She had a good Christmas.  She lives with her 81 year old son, her sister and her 62 year old nephew.  Patient is anxious because school will start in person in January and she is not sure how it works.  She feels the ADD medicine working.  She is able to complete her assignment and task on time.  Her attention, concentration is good.  She has no tremors, shakes, rash or any itching.  Her weight is stable.  She has no rash from Lamictal.  She denies drinking or using any illegal substances.  She like to continue current medication.  Past Psychiatric History:Reviewed. H/Omood swing, anger, auditory and visual hallucination, impulsive behavior with excessive buying and shopping. H/Oinpatient in 2001 at [facility] for taking overdose with alcohol. Never seen psychiatrist upon discharge and given psychiatric medication by PCP. Took Seroquel but stopped after pregnancy. Took Ritalin, Concerta, Abilify, Lamictal and Xanax from PCP. Never had psychological testing for  ADHD.   Psychiatric Specialty Exam: Physical Exam  Review of Systems  There were no vitals taken for this visit.There is no height or weight on file to calculate BMI.  General Appearance: NA  Eye Contact:  NA  Speech:  Clear and Coherent and Normal Rate  Volume:  Normal  Mood:  Euthymic  Affect:  NA  Thought Process:  Goal Directed  Orientation:  Full (Time, Place, and Person)  Thought Content:  WDL  Suicidal Thoughts:  No  Homicidal Thoughts:  No  Memory:  Immediate;   Good Recent;   Good Remote;   Good  Judgement:  Intact  Insight:  Present  Psychomotor Activity:  NA  Concentration:  Concentration: Good and Attention Span: Good  Recall:  Good  Fund of Knowledge:  Good  Language:  Good  Akathisia:  No  Handed:  Right  AIMS (if indicated):     Assets:  Communication Skills Desire for Improvement Housing Resilience Social Support  ADL's:  Intact  Cognition:  WNL  Sleep:   ok      Assessment and Plan: ADHD, hyperactivity type.  Bipolar disorder type I.  Patient is a stable on her current medication.  She was recently called her Lamictal and Abilify and she will need a new prescription of Concerta.  Discussed medication side effect specially stimulant abuse, withdrawal and dependency.  Continue Concerta 54 mg every morning, Abilify 15 mg daily and Lamictal 200 g daily.  Recommended to call us back if she has any question of any concern.  Follow-up in 3 months.  Follow Up Instructions:    I discussed the assessment  and treatment plan with the patient. The patient was provided an opportunity to ask questions and all were answered. The patient agreed with the plan and demonstrated an understanding of the instructions.   The patient was advised to call back or seek an in-person evaluation if the symptoms worsen or if the condition fails to improve as anticipated.  I provided 20 minutes of non-face-to-face time during this encounter.   Kathlee Nations, MD

## 2019-05-18 ENCOUNTER — Encounter (HOSPITAL_COMMUNITY): Payer: Self-pay | Admitting: Psychiatry

## 2019-05-18 ENCOUNTER — Ambulatory Visit (INDEPENDENT_AMBULATORY_CARE_PROVIDER_SITE_OTHER): Payer: BC Managed Care – PPO | Admitting: Psychiatry

## 2019-05-18 ENCOUNTER — Other Ambulatory Visit: Payer: Self-pay

## 2019-05-18 DIAGNOSIS — F319 Bipolar disorder, unspecified: Secondary | ICD-10-CM

## 2019-05-18 DIAGNOSIS — F901 Attention-deficit hyperactivity disorder, predominantly hyperactive type: Secondary | ICD-10-CM

## 2019-05-18 MED ORDER — ARIPIPRAZOLE 15 MG PO TABS: 15.0000 mg | ORAL_TABLET | Freq: Every day | ORAL | 0 refills | Status: DC

## 2019-05-18 MED ORDER — METHYLPHENIDATE HCL ER (OSM) 54 MG PO TBCR: 54.0000 mg | EXTENDED_RELEASE_TABLET | ORAL | 0 refills | Status: DC

## 2019-05-18 MED ORDER — LAMOTRIGINE 200 MG PO TABS: 200.0000 mg | ORAL_TABLET | Freq: Every day | ORAL | 0 refills | Status: DC

## 2019-05-18 NOTE — Progress Notes (Signed)
Virtual Visit via Telephone Note  I connected with Tricia Sanford on 05/18/19 at 10:00 AM EDT by telephone and verified that I am speaking with the correct person using two identifiers.   I discussed the limitations, risks, security and privacy concerns of performing an evaluation and management service by telephone and the availability of in person appointments. I also discussed with the patient that there may be a patient responsible charge related to this service. The patient expressed understanding and agreed to proceed.   History of Present Illness: Patient was evaluated by phone session.  She is taking her medication as prescribed and she reported her mood, mania and attention focus is stable.  Lately she has been very busy at work and working long hours.  She is working from home.  She is pleased that her 25 year old son started going to school.  Her 24 year old nephew and sister also lives with her and she is happy that they have been very helpful and supportive.  She does not want to change medication since they are working very well.  She has no rash, itching, tremors or shakes.  She denies drinking or using any illegal substances.  She denies any weight change and feel things are going well.  She is working at [employer] and has been lately very busy at work.  Past Psychiatric History:Reviewed. H/Omood swing, anger, A/H and V/H, impulsive behavior with excessive buying and shopping. H/Oinpatient in 2001at [facility] for overdose with alcohol. N0 f/u with psychiatrist upon d/c and meds given by PCP. Took Seroquel but stopped after pregnancy. Took Ritalin, Concerta, Abilify, Lamictal and Xanax from PCP. Never had psychological testing for ADHD.   Psychiatric Specialty Exam: Physical Exam  Review of Systems  There were no vitals taken for this visit.There is no height or weight on file to calculate BMI.  General Appearance: NA  Eye Contact:  NA   Speech:  Clear and Coherent  Volume:  Normal  Mood:  Euthymic  Affect:  NA  Thought Process:  Goal Directed  Orientation:  Full (Time, Place, and Person)  Thought Content:  WDL  Suicidal Thoughts:  No  Homicidal Thoughts:  No  Memory:  Immediate;   Good Recent;   Good Remote;   Good  Judgement:  Good  Insight:  Good  Psychomotor Activity:  NA  Concentration:  Concentration: Good and Attention Span: Good  Recall:  Good  Fund of Knowledge:  Good  Language:  Good  Akathisia:  No  Handed:  Right  AIMS (if indicated):     Assets:  Communication Skills Desire for Improvement Housing Resilience Social Support Talents/Skills Transportation  ADL's:  Intact  Cognition:  WNL  Sleep:   ok      Assessment and Plan: ADHD, hyperactivity type.  Bipolar disorder type I.  Patient is stable on her current medication.  Continue Lamictal 200 mg daily, Concerta 54 mg every morning and Abilify 15 mg daily.  Discussed medication side effects and benefits.  Recommended to call us back if she has any question or any concern.  Follow-up in 3 months.  Follow Up Instructions:    I discussed the assessment and treatment plan with the patient. The patient was provided an opportunity to ask questions and all were answered. The patient agreed with the plan and demonstrated an understanding of the instructions.   The patient was advised to call back or seek an in-person evaluation if the symptoms worsen or if the condition  fails to improve as anticipated.  I provided 20 minutes of non-face-to-face time during this encounter.   Kathlee Nations, MD

## 2019-08-18 ENCOUNTER — Other Ambulatory Visit: Payer: Self-pay

## 2019-08-18 ENCOUNTER — Telehealth (INDEPENDENT_AMBULATORY_CARE_PROVIDER_SITE_OTHER): Payer: BC Managed Care – PPO | Admitting: Psychiatry

## 2019-08-18 DIAGNOSIS — F901 Attention-deficit hyperactivity disorder, predominantly hyperactive type: Secondary | ICD-10-CM | POA: Diagnosis not present

## 2019-08-18 DIAGNOSIS — F319 Bipolar disorder, unspecified: Secondary | ICD-10-CM

## 2019-08-18 MED ORDER — ARIPIPRAZOLE 15 MG PO TABS: 15.0000 mg | ORAL_TABLET | Freq: Every day | ORAL | 0 refills | Status: DC

## 2019-08-18 MED ORDER — LAMOTRIGINE 200 MG PO TABS: 200.0000 mg | ORAL_TABLET | Freq: Every day | ORAL | 0 refills | Status: DC

## 2019-08-18 MED ORDER — METHYLPHENIDATE HCL ER (OSM) 36 MG PO TBCR: 36.0000 mg | EXTENDED_RELEASE_TABLET | ORAL | 0 refills | Status: DC

## 2019-08-18 NOTE — Progress Notes (Signed)
Virtual Visit via Telephone Note  I connected with Tricia Sanford on 08/18/19 at 10:00 AM EDT by telephone and verified that I am speaking with the correct person using two identifiers.  Location: Patient: home Provider: home office   I discussed the limitations, risks, security and privacy concerns of performing an evaluation and management service by telephone and the availability of in person appointments. I also discussed with the patient that there may be a patient responsible charge related to this service. The patient expressed understanding and agreed to proceed.   History of Present Illness: Patient is evaluated by phone session.  She is taking the medication but sometimes she feels having racing thoughts and anxiety.  She is taking melatonin 5 mg but some nights it does not work.  Her son is doing better.  She feels the medicine helping and she can do her focus attention and multitasking much better.  She is working from home and not sure when she I will go back in person.  Her 27 year old nephew and sister also lives with her.  She denies drinking or using any illegal substances.  Today her speech is a little fast and she appears more elated.  She denies any mania, psychosis, crying spells or any feeling of hopelessness or worthlessness.  She is working in a company called [employer] and has been very busy at work.     Past Psychiatric History:Reviewed. H/Omood swing, anger, A/H and V/H, impulsive behavior with excessive buying and shopping. H/Oinpatient in 2001at [facility] for overdose with alcohol. No f/u with psychiatrist upon d/c and meds given by PCP. Took Seroquel but stopped after pregnancy. Took Ritalin, Concerta, Abilify, Lamictal and Xanax from PCP. Never had psychological testing for ADHD.     Psychiatric Specialty Exam: Physical Exam  Review of Systems  There were no vitals taken for this visit.There is no height or weight on file to calculate BMI.   General Appearance: NA  Eye Contact:  NA  Speech:  fast  Volume:  Normal  Mood:  Euthymic  Affect:  NA  Thought Process:  Goal Directed  Orientation:  Full (Time, Place, and Person)  Thought Content:  WDL  Suicidal Thoughts:  No  Homicidal Thoughts:  No  Memory:  Immediate;   Good Recent;   Good Remote;   Good  Judgement:  Good  Insight:  Good  Psychomotor Activity:  NA  Concentration:  Concentration: Good and Attention Span: Good  Recall:  Good  Fund of Knowledge:  Fair  Language:  Good  Akathisia:  No  Handed:  Right  AIMS (if indicated):     Assets:  Communication Skills Desire for Improvement Housing Resilience Social Support Transportation  ADL's:  Intact  Cognition:  WNL  Sleep:   fair      Assessment and Plan: ADHD, hyperactivity type.  Bipolar disorder type I.  I reviewed the medication with her and recommend that she can try cutting down Concerta if it can help her sleep and anxiety.  She also have blood pressure and I explained stimulant can cause high blood pressure, anxiety and insomnia.  She is willing to try Concerta 36 mg daily for 1 month to see if that works and she will call us if there is an issue.  We will continue Lamictal 200 mg daily and Abilify 15 mg daily.  She has no rash, itching tremors or shakes.  Recommended to call us back if she has any question or any concern.  Follow-up in 3 months.  Follow Up Instructions:    I discussed the assessment and treatment plan with the patient. The patient was provided an opportunity to ask questions and all were answered. The patient agreed with the plan and demonstrated an understanding of the instructions.   The patient was advised to call back or seek an in-person evaluation if the symptoms worsen or if the condition fails to improve as anticipated.  I provided 15 minutes of non-face-to-face time during this encounter.   Cleotis Nipper, MD

## 2019-09-17 ENCOUNTER — Telehealth (HOSPITAL_COMMUNITY): Payer: Self-pay | Admitting: *Deleted

## 2019-09-17 DIAGNOSIS — F901 Attention-deficit hyperactivity disorder, predominantly hyperactive type: Secondary | ICD-10-CM

## 2019-09-17 NOTE — Telephone Encounter (Signed)
Pt called requesting refill on the Concerta 36mg  last ordered 08/18/19. Pt has an upcoming appointment on 11/18/19. Please review.

## 2019-09-20 MED ORDER — METHYLPHENIDATE HCL ER (OSM) 36 MG PO TBCR: 36.0000 mg | EXTENDED_RELEASE_TABLET | ORAL | 0 refills | Status: DC

## 2019-09-20 NOTE — Telephone Encounter (Signed)
Done

## 2019-10-29 ENCOUNTER — Telehealth (HOSPITAL_COMMUNITY): Payer: Self-pay | Admitting: *Deleted

## 2019-10-29 DIAGNOSIS — F901 Attention-deficit hyperactivity disorder, predominantly hyperactive type: Secondary | ICD-10-CM

## 2019-10-29 MED ORDER — METHYLPHENIDATE HCL ER (OSM) 36 MG PO TBCR: 36.0000 mg | EXTENDED_RELEASE_TABLET | ORAL | 0 refills | Status: DC

## 2019-10-29 NOTE — Telephone Encounter (Signed)
Done

## 2019-10-29 NOTE — Telephone Encounter (Signed)
Pt called requesting refill of the Concerta 36mg  CR last ordered 09/20/19. Pt has appointment with you on the books for 11/18/19. Please review.

## 2019-11-11 ENCOUNTER — Other Ambulatory Visit (HOSPITAL_COMMUNITY): Payer: Self-pay | Admitting: Psychiatry

## 2019-11-11 DIAGNOSIS — F319 Bipolar disorder, unspecified: Secondary | ICD-10-CM

## 2019-11-18 ENCOUNTER — Telehealth (INDEPENDENT_AMBULATORY_CARE_PROVIDER_SITE_OTHER): Payer: BC Managed Care – PPO | Admitting: Psychiatry

## 2019-11-18 ENCOUNTER — Encounter (HOSPITAL_COMMUNITY): Payer: Self-pay | Admitting: Psychiatry

## 2019-11-18 ENCOUNTER — Other Ambulatory Visit: Payer: Self-pay

## 2019-11-18 DIAGNOSIS — F319 Bipolar disorder, unspecified: Secondary | ICD-10-CM

## 2019-11-18 DIAGNOSIS — F901 Attention-deficit hyperactivity disorder, predominantly hyperactive type: Secondary | ICD-10-CM

## 2019-11-18 MED ORDER — ARIPIPRAZOLE 15 MG PO TABS: 15.0000 mg | ORAL_TABLET | Freq: Every day | ORAL | 0 refills | Status: DC

## 2019-11-18 MED ORDER — METHYLPHENIDATE HCL ER (OSM) 36 MG PO TBCR: 36.0000 mg | EXTENDED_RELEASE_TABLET | ORAL | 0 refills | Status: DC

## 2019-11-18 MED ORDER — LAMOTRIGINE 200 MG PO TABS: 200.0000 mg | ORAL_TABLET | Freq: Every day | ORAL | 0 refills | Status: DC

## 2019-11-18 NOTE — Progress Notes (Signed)
Virtual Visit via Telephone Note  I connected with Tricia Sanford on 11/18/19 at 10:00 AM EDT by telephone and verified that I am speaking with the correct person using two identifiers.  Location: Patient: Home Provider: Home Office   I discussed the limitations, risks, security and privacy concerns of performing an evaluation and management service by telephone and the availability of in person appointments. I also discussed with the patient that there may be a patient responsible charge related to this service. The patient expressed understanding and agreed to proceed.   History of Present Illness: Patient is evaluated by phone session.  On the last visit we cut down her Concerta and she is taking 36 mg.  She admitted some struggles with the focus and attention but no major concern.  We cut on the dose because patient complained about insomnia, racing thoughts and occasional anxiety.  She is taking melatonin but there is still some nights when she struggled with insomnia.  She denies any anxiety or nervousness.  She feels her Lamictal and Abilify is helping her mood, mania, agitation and depression.  Lately her company had fired a lot of people and there is a workload and some weekends she has to work.  She feels the medicine is helping and her attention, focus is good.  She is able to do multitasking.  Her energy level is good.  Her appetite is okay.  She denies any crying spells or any feeling of hopelessness.  She has upcoming appointment to see her PCP Dr. Benny Lennert and she will do blood work and physical.  Her living situation is unchanged.  Her 48 year old nephew and sister lives with her.  Patient denies drinking or using any illegal substances.  She has no rash, itching, tremors or shakes.  She is working from home for [employer] and has been very busy at work.     Past Psychiatric History: H/Omood swing, anger,A/H and V/H, impulsive behavior with excessive buying and  shopping. H/Oinpatient in 2001at [facility] for overdose with alcohol. No f/u withpsychiatrist upon d/cand meds givenby PCP. Took Seroquel but stopped after pregnancy. Took Ritalin, Concerta, Abilify, Lamictal and Xanax from PCP. Never had psychological testing for ADHD.  Psychiatric Specialty Exam: Physical Exam  Review of Systems  There were no vitals taken for this visit.There is no height or weight on file to calculate BMI.  General Appearance: NA  Eye Contact:  NA  Speech:  Clear and Coherent  Volume:  Normal  Mood:  Euthymic  Affect:  NA  Thought Process:  Goal Directed  Orientation:  Full (Time, Place, and Person)  Thought Content:  WDL  Suicidal Thoughts:  No  Homicidal Thoughts:  No  Memory:  Immediate;   Good Recent;   Good Remote;   Good  Judgement:  Good  Insight:  Good  Psychomotor Activity:  NA  Concentration:  Concentration: Good and Attention Span: Good  Recall:  Good  Fund of Knowledge:  Good  Language:  Good  Akathisia:  No  Handed:  Right  AIMS (if indicated):     Assets:  Communication Skills Desire for Improvement Housing Resilience Social Support Talents/Skills Transportation  ADL's:  Intact  Cognition:  WNL  Sleep:   fair      Assessment and Plan: ADHD, hyperactivity type.  Bipolar disorder type I.  Discussed stimulant use, indication and possible side effects.  After some discussion she agreed to continue the current dose of Concerta 36 mg daily  since it is working.  I encouraged to keep appointment with her PCP for annual physical checkup and blood work.  I also recommend that she can try melatonin up to 10 mg to help insomnia.  She has no rash, itching tremors or shakes.  Continue Abilify 15 mg at bedtime, Lamictal 200 mg daily and she will continue Concerta 36 mg daily.  Recommended to call us back if she has any question or any concern.  Follow-up in 3 months.  Follow Up Instructions:    I discussed the assessment and  treatment plan with the patient. The patient was provided an opportunity to ask questions and all were answered. The patient agreed with the plan and demonstrated an understanding of the instructions.   The patient was advised to call back or seek an in-person evaluation if the symptoms worsen or if the condition fails to improve as anticipated.  I provided 19 minutes of non-face-to-face time during this encounter.   Cleotis Nipper, MD

## 2019-12-27 ENCOUNTER — Telehealth (HOSPITAL_COMMUNITY): Payer: Self-pay | Admitting: *Deleted

## 2019-12-27 DIAGNOSIS — F901 Attention-deficit hyperactivity disorder, predominantly hyperactive type: Secondary | ICD-10-CM

## 2019-12-27 NOTE — Telephone Encounter (Signed)
Pt called requesting a refill of the Concerta. Pt has an upcoming appointment on 02/17/20. Please review.

## 2019-12-28 MED ORDER — METHYLPHENIDATE HCL ER (OSM) 36 MG PO TBCR: 36.0000 mg | EXTENDED_RELEASE_TABLET | ORAL | 0 refills | Status: DC

## 2019-12-28 NOTE — Telephone Encounter (Signed)
Done

## 2020-01-26 ENCOUNTER — Telehealth (HOSPITAL_COMMUNITY): Payer: Self-pay | Admitting: *Deleted

## 2020-01-26 DIAGNOSIS — F901 Attention-deficit hyperactivity disorder, predominantly hyperactive type: Secondary | ICD-10-CM

## 2020-01-26 MED ORDER — METHYLPHENIDATE HCL ER (OSM) 36 MG PO TBCR: 36.0000 mg | EXTENDED_RELEASE_TABLET | ORAL | 0 refills | Status: DC

## 2020-01-26 NOTE — Telephone Encounter (Signed)
Pt called requesting refill of the Methylphenidate CR 36mg . Pt has an upcoming appointment on 02/17/20.

## 2020-01-26 NOTE — Telephone Encounter (Signed)
Done

## 2020-02-09 ENCOUNTER — Other Ambulatory Visit (HOSPITAL_COMMUNITY): Payer: Self-pay | Admitting: Psychiatry

## 2020-02-09 DIAGNOSIS — F319 Bipolar disorder, unspecified: Secondary | ICD-10-CM

## 2020-02-17 ENCOUNTER — Other Ambulatory Visit: Payer: Self-pay

## 2020-02-17 ENCOUNTER — Ambulatory Visit (INDEPENDENT_AMBULATORY_CARE_PROVIDER_SITE_OTHER): Payer: BC Managed Care – PPO | Admitting: Psychiatry

## 2020-02-17 ENCOUNTER — Encounter (HOSPITAL_COMMUNITY): Payer: Self-pay | Admitting: Psychiatry

## 2020-02-17 DIAGNOSIS — F319 Bipolar disorder, unspecified: Secondary | ICD-10-CM

## 2020-02-17 DIAGNOSIS — F901 Attention-deficit hyperactivity disorder, predominantly hyperactive type: Secondary | ICD-10-CM | POA: Diagnosis not present

## 2020-02-17 MED ORDER — METHYLPHENIDATE HCL ER (OSM) 36 MG PO TBCR: 36.0000 mg | EXTENDED_RELEASE_TABLET | ORAL | 0 refills | Status: DC

## 2020-02-17 MED ORDER — LAMOTRIGINE 200 MG PO TABS: 200.0000 mg | ORAL_TABLET | Freq: Every day | ORAL | 0 refills | Status: DC

## 2020-02-17 MED ORDER — ARIPIPRAZOLE 15 MG PO TABS: 15.0000 mg | ORAL_TABLET | Freq: Every day | ORAL | 0 refills | Status: DC

## 2020-02-17 MED ORDER — TRAZODONE HCL 50 MG PO TABS
50.0000 mg | ORAL_TABLET | Freq: Every day | ORAL | 0 refills | Status: DC
Start: 2020-02-17 — End: 2020-03-29

## 2020-02-17 NOTE — Progress Notes (Signed)
Virtual Visit via Telephone Note  I connected with Tricia Sanford on 02/17/20 at 10:40 AM EST by telephone and verified that I am speaking with the correct person using two identifiers.  Location: Patient: Home Provider: Home Office   I discussed the limitations, risks, security and privacy concerns of performing an evaluation and management service by telephone and the availability of in person appointments. I also discussed with the patient that there may be a patient responsible charge related to this service. The patient expressed understanding and agreed to proceed.   History of Present Illness: Patient is evaluated by phone session.  She is on the phone by herself.  She endorses stress from work as half of the work.  She is laid off.  She is pleased that at least she had a job.  She is working for a company called [employer].  Her job is busy.  She still struggle with insomnia.  We had cut down her stimulant though she feel little bit better but is still wake up at night.  She is taking melatonin but that does not help as much.  She has some residual mood lability but denies any mania, psychosis or any hallucination.  She was pleased as Christmas went well.  She was not hosting and she went to her niece's house.  Her sister and nephew still living work her.  She apologized not having blood work but promised to have blood work in January.  Patient told she required blood work for her insurance and it will help if she do the blood work in January.  She has not checked her weight but reported she may have gained a few pounds on the holidays.  She has no tremors, shakes or any EPS.  She denies any crying spells.  Her attention and focus is okay and she does not feel it is worsening.  She denies drinking or using any illegal substances.  Past Psychiatric History: H/Omood swing, anger,A/H and V/H, impulsive behavior with excessive buying and shopping. H/Oinpatient in 2001at [facility] for overdose with alcohol. Nof/u withpsychiatrist upon d/cand meds givenby PCP. Took Seroquel but stopped after pregnancy. Took Ritalin, Concerta, Abilify, Lamictal and Xanax from PCP. Never had psychological testing for ADHD.   Psychiatric Specialty Exam: Physical Exam  Review of Systems  There were no vitals taken for this visit.There is no height or weight on file to calculate BMI.  General Appearance: NA  Eye Contact:  NA  Speech:  Normal Rate  Volume:  Normal  Mood:  Euthymic  Affect:  NA  Thought Process:  Goal Directed  Orientation:  Full (Time, Place, and Person)  Thought Content:  WDL  Suicidal Thoughts:  No  Homicidal Thoughts:  No  Memory:  Immediate;   Good Recent;   Good Remote;   Good  Judgement:  Intact  Insight:  Present  Psychomotor Activity:  NA  Concentration:  Concentration: Fair and Attention Span: Fair  Recall:  Good  Fund of Knowledge:  Good  Language:  Good  Akathisia:  No  Handed:  Right  AIMS (if indicated):     Assets:  Communication Skills Desire for Improvement Housing Social Support Talents/Skills Transportation  ADL's:  Intact  Cognition:  WNL  Sleep:   fair      Assessment and Plan: ADHD, hyperactivity type.  Bipolar disorder type I.  Encouraged to have a blood work and physical.  She has not checked her weight in a while but promised that  she will do it when she had a physical.  We talked about trying low-dose trazodone to help insomnia.  She agreed with that.  I will continue Abilify 15 mg at bedtime, Lamictal 200 mg daily and Concerta 36 mg in the morning.  Discussed medication side effects and benefits.  Recommended to call us back if she is any question or any concern.  She will also try trazodone 50-100 mg by insomnia and she will call us if she feels it is helping her.  Follow-up in 3 months.  Follow Up Instructions:    I discussed the assessment and treatment plan with the patient. The patient was provided  an opportunity to ask questions and all were answered. The patient agreed with the plan and demonstrated an understanding of the instructions.   The patient was advised to call back or seek an in-person evaluation if the symptoms worsen or if the condition fails to improve as anticipated.  I provided 14 minutes of non-face-to-face time during this encounter.   Cleotis Nipper, MD

## 2020-03-29 ENCOUNTER — Telehealth (HOSPITAL_COMMUNITY): Payer: Self-pay | Admitting: *Deleted

## 2020-03-29 DIAGNOSIS — F319 Bipolar disorder, unspecified: Secondary | ICD-10-CM

## 2020-03-29 DIAGNOSIS — F901 Attention-deficit hyperactivity disorder, predominantly hyperactive type: Secondary | ICD-10-CM

## 2020-03-29 MED ORDER — TRAZODONE HCL 50 MG PO TABS: 50.0000 mg | ORAL_TABLET | Freq: Every day | ORAL | 0 refills | Status: DC

## 2020-03-29 MED ORDER — METHYLPHENIDATE HCL ER (OSM) 36 MG PO TBCR: 36.0000 mg | EXTENDED_RELEASE_TABLET | ORAL | 0 refills | Status: DC

## 2020-03-29 NOTE — Telephone Encounter (Signed)
Pt called requesting refill on the Concerta and Trazodone. Pt next appointment is 05/03/20. Please review. Thanks.

## 2020-03-29 NOTE — Telephone Encounter (Signed)
Done

## 2020-04-27 ENCOUNTER — Telehealth (HOSPITAL_COMMUNITY): Payer: Self-pay | Admitting: *Deleted

## 2020-04-27 DIAGNOSIS — F901 Attention-deficit hyperactivity disorder, predominantly hyperactive type: Secondary | ICD-10-CM

## 2020-04-27 NOTE — Telephone Encounter (Signed)
Patient called and requested script for methylphenidate.  Patient has an appt 05/03/20 but stated she will run out.  Please review.

## 2020-04-29 ENCOUNTER — Other Ambulatory Visit (HOSPITAL_COMMUNITY): Payer: Self-pay | Admitting: Psychiatry

## 2020-04-29 DIAGNOSIS — F319 Bipolar disorder, unspecified: Secondary | ICD-10-CM

## 2020-05-01 MED ORDER — METHYLPHENIDATE HCL ER (OSM) 36 MG PO TBCR: 36.0000 mg | EXTENDED_RELEASE_TABLET | ORAL | 0 refills | Status: AC

## 2020-05-01 NOTE — Telephone Encounter (Signed)
Send to [facility] in [place].

## 2020-05-03 ENCOUNTER — Telehealth (HOSPITAL_COMMUNITY): Payer: BC Managed Care – PPO | Admitting: Psychiatry

## 2020-05-03 ENCOUNTER — Other Ambulatory Visit: Payer: Self-pay

## 2020-05-08 ENCOUNTER — Other Ambulatory Visit (HOSPITAL_COMMUNITY): Payer: Self-pay | Admitting: *Deleted

## 2020-05-08 DIAGNOSIS — F319 Bipolar disorder, unspecified: Secondary | ICD-10-CM

## 2020-05-08 MED ORDER — ARIPIPRAZOLE 15 MG PO TABS: 15.0000 mg | ORAL_TABLET | Freq: Every day | ORAL | 0 refills | Status: AC

## 2020-05-08 MED ORDER — TRAZODONE HCL 50 MG PO TABS: 50.0000 mg | ORAL_TABLET | Freq: Every day | ORAL | 0 refills | Status: AC

## 2020-05-08 MED ORDER — LAMOTRIGINE 200 MG PO TABS: 200.0000 mg | ORAL_TABLET | Freq: Every day | ORAL | 0 refills | Status: AC

## 2020-06-13 NOTE — Telephone Encounter (Signed)
Opened in error

## 2020-07-14 ENCOUNTER — Other Ambulatory Visit (HOSPITAL_COMMUNITY): Payer: Self-pay | Admitting: Psychiatry

## 2020-07-14 DIAGNOSIS — F319 Bipolar disorder, unspecified: Secondary | ICD-10-CM
